# Patient Record
Sex: Male | Born: 1980 | Race: Black or African American | Hispanic: No | Marital: Married | Smoking: Current every day smoker
Health system: Southern US, Community
[De-identification: ages and names within clinical notes are randomized; demographics above are authoritative.]

## PROBLEM LIST (undated history)

## (undated) HISTORY — PX: FRACTURE SURGERY: SHX138

---

## 2017-12-16 ENCOUNTER — Encounter: Payer: Self-pay | Admitting: Emergency Medicine

## 2017-12-16 ENCOUNTER — Emergency Department: Payer: 59

## 2017-12-16 ENCOUNTER — Emergency Department
Admission: EM | Admit: 2017-12-16 | Discharge: 2017-12-16 | Disposition: A | Discharge status: Home / Self Care | Payer: 59 | Attending: Emergency Medicine | Admitting: Emergency Medicine

## 2017-12-16 DIAGNOSIS — M25512 Pain in left shoulder: Secondary | ICD-10-CM | POA: Diagnosis present

## 2017-12-16 DIAGNOSIS — R0789 Other chest pain: Secondary | ICD-10-CM | POA: Insufficient documentation

## 2017-12-16 DIAGNOSIS — F17200 Nicotine dependence, unspecified, uncomplicated: Secondary | ICD-10-CM | POA: Diagnosis not present

## 2017-12-16 LAB — BASIC METABOLIC PANEL
Anion gap: 9 (ref 5–15)
BUN: 11 mg/dL (ref 6–20)
CALCIUM: 9.3 mg/dL (ref 8.9–10.3)
CO2: 24 mmol/L (ref 22–32)
CREATININE: 0.96 mg/dL (ref 0.61–1.24)
Chloride: 105 mmol/L (ref 101–111)
Glucose, Bld: 101 mg/dL — ABNORMAL HIGH (ref 65–99)
Potassium: 3.7 mmol/L (ref 3.5–5.1)
SODIUM: 138 mmol/L (ref 135–145)

## 2017-12-16 LAB — CBC
HCT: 43.6 % (ref 40.0–52.0)
HEMOGLOBIN: 15 g/dL (ref 13.0–18.0)
MCH: 32.9 pg (ref 26.0–34.0)
MCHC: 34.3 g/dL (ref 32.0–36.0)
MCV: 95.9 fL (ref 80.0–100.0)
PLATELETS: 257 10*3/uL (ref 150–440)
RBC: 4.55 MIL/uL (ref 4.40–5.90)
RDW: 11.9 % (ref 11.5–14.5)
WBC: 9.2 10*3/uL (ref 3.8–10.6)

## 2017-12-16 LAB — TROPONIN I

## 2017-12-16 MED ORDER — IBUPROFEN 800 MG PO TABS: 800.0000 mg | ORAL_TABLET | Freq: Three times a day (TID) | ORAL | 0 refills | Status: DC | PRN

## 2017-12-16 MED ORDER — DIAZEPAM 5 MG PO TABS: 5.0000 mg | ORAL_TABLET | Freq: Three times a day (TID) | ORAL | 0 refills | Status: DC | PRN

## 2017-12-16 NOTE — ED Notes (Signed)
Pt presents today with numbness in the left arm Pt denies pain in chest or arm numbness at this time.

## 2017-12-16 NOTE — ED Provider Notes (Signed)
[facility] Emergency Department Provider Note       Time seen: ----------------------------------------- 4:05 PM on 12/16/2017 -----------------------------------------   I have reviewed the triage vital signs and the nursing notes.  HISTORY   Chief Complaint Chest Pain and Numbness    HPI Bryan Delacruz is a 37 y.o. male with no significant past medical history who presents to the ED for left shoulder pain with some associated numbness in his left arm at 11 AM today.  Currently symptoms are gone.  He does have discomfort that he describes as a funny feeling in the left side of his chest.  Patient states he has had this happen once in the past and this was diagnosed as a panic attack.  History reviewed. No pertinent past medical history.  There are no active problems to display for this patient.   Past Surgical History:  Procedure Laterality Date  . FRACTURE SURGERY      Allergies Patient has no known allergies.  Social History Social History   Tobacco Use  . Smoking status: Current Every Day Smoker  . Smokeless tobacco: Never Used  Substance Use Topics  . Alcohol use: Yes  . Drug use: Not Currently   Review of Systems Constitutional: Negative for fever. Cardiovascular: Positive for chest pain Respiratory: Negative for shortness of breath. Gastrointestinal: Negative for abdominal pain, vomiting and diarrhea. Genitourinary: Negative for dysuria. Musculoskeletal: Positive for left shoulder pain Skin: Negative for rash. Neurological: Negative for headaches, positive for transient numbness from the left elbow to the left wrist  All systems negative/normal/unremarkable except as stated in the HPI  ____________________________________________   PHYSICAL EXAM:  VITAL SIGNS: ED Triage Vitals [12/16/17 1315]  Enc Vitals Group     BP (!) 142/91     Pulse Rate 77     Resp 14     Temp 98.4 F (36.9 C)     Temp Source Oral     SpO2 98  %     Weight 160 lb (72.6 kg)     Height 5\' 11"  (1.803 m)     Head Circumference      Peak Flow      Pain Score 5     Pain Loc      Pain Edu?      Excl. in GC?    Constitutional: Alert and oriented. Well appearing and in no distress. Eyes: Conjunctivae are normal. Normal extraocular movements. ENT   Head: Normocephalic and atraumatic.   Nose: No congestion/rhinnorhea.   Mouth/Throat: Mucous membranes are moist.   Neck: No stridor. Cardiovascular: Normal rate, regular rhythm. No murmurs, rubs, or gallops. Respiratory: Normal respiratory effort without tachypnea nor retractions. Breath sounds are clear and equal bilaterally. No wheezes/rales/rhonchi. Gastrointestinal: Soft and nontender. Normal bowel sounds Musculoskeletal: Nontender with normal range of motion in extremities. No lower extremity tenderness nor edema. Neurologic:  Normal speech and language. No gross focal neurologic deficits are appreciated.  Strength, sensation, cranial nerves are normal Skin:  Skin is warm, dry and intact. No rash noted. Psychiatric: Mood and affect are normal. Speech and behavior are normal.  ____________________________________________  EKG: Interpreted by me.  Sinus rhythm with sinus arrhythmia, rate is 70 bpm, normal PR interval, normal QRS, normal QT.  ____________________________________________  ED COURSE:  As part of my medical decision making, I reviewed the following data within the electronic MEDICAL RECORD NUMBER History obtained from family if available, nursing notes, old chart and ekg, as well as notes from prior ED  visits. Patient presented for chest pain as well as shoulder pain and numbness, we will assess with labs and imaging as indicated at this time.   Procedures ____________________________________________   LABS (pertinent positives/negatives)  Labs Reviewed  BASIC METABOLIC PANEL - Abnormal; Notable for the following components:      Result Value   Glucose,  Bld 101 (*)    All other components within normal limits  CBC  TROPONIN I    RADIOLOGY Images were viewed by me  Chest x-ray is normal  ____________________________________________  DIFFERENTIAL DIAGNOSIS   Musculoskeletal pain, costochondritis, cervical radiculopathy, anxiety  FINAL ASSESSMENT AND PLAN  Nonspecific chest pain   Plan: The patient had presented for chest pain as well as shoulder pain and numbness. Patient's labs were reassuring. Patient's imaging was also reassuring.  This seems to be musculoskeletal in origin.  He will be discharged with Motrin and Valium and is stable for outpatient follow-up.   Ulice Dash, MD   Note: This note was generated in part or whole with voice recognition software. Voice recognition is usually quite accurate but there are transcription errors that can and very often do occur. I apologize for any typographical errors that were not detected and corrected.     Emily Filbert, MD 12/16/17 [id number]

## 2017-12-16 NOTE — ED Triage Notes (Signed)
Pain in left shoulder and numbness in left arm at 11a today.  Gone now.  Now with discomfort in chest that just is a funny feeling.

## 2018-05-26 ENCOUNTER — Ambulatory Visit (INDEPENDENT_AMBULATORY_CARE_PROVIDER_SITE_OTHER): Payer: 59

## 2018-05-26 ENCOUNTER — Other Ambulatory Visit: Payer: Self-pay

## 2018-05-26 ENCOUNTER — Ambulatory Visit
Admission: EM | Admit: 2018-05-26 | Discharge: 2018-05-26 | Disposition: A | Discharge status: Home / Self Care | Payer: 59 | Attending: Family Medicine | Admitting: Family Medicine

## 2018-05-26 DIAGNOSIS — S62141A Displaced fracture of body of hamate [unciform] bone, right wrist, initial encounter for closed fracture: Secondary | ICD-10-CM

## 2018-05-26 DIAGNOSIS — S62346A Nondisplaced fracture of base of fifth metacarpal bone, right hand, initial encounter for closed fracture: Secondary | ICD-10-CM

## 2018-05-26 DIAGNOSIS — W19XXXA Unspecified fall, initial encounter: Secondary | ICD-10-CM

## 2018-05-26 MED ORDER — HYDROCODONE-ACETAMINOPHEN 5-325 MG PO TABS: ORAL_TABLET | ORAL | 0 refills | Status: DC

## 2018-05-26 NOTE — ED Provider Notes (Addendum)
[facility]    CSN: [id number] Arrival date & time: 05/26/18  0918     History   Chief Complaint Chief Complaint  Patient presents with  . Hand Pain    right    HPI Bryan Delacruz is a 37 y.o. male.   37 yo male with a c/o right hand pain after falling last night while walking his dog. States he fell on the ground and landed on his hand/wrist.   The history is provided by the patient.  Hand Pain     History reviewed. No pertinent past medical history.  There are no active problems to display for this patient.   Past Surgical History:  Procedure Laterality Date  . FRACTURE SURGERY         Home Medications    Prior to Admission medications   Medication Sig Start Date End Date Taking? Authorizing Provider  diazepam (VALIUM) 5 MG tablet Take 1 tablet (5 mg total) by mouth every 8 (eight) hours as needed for muscle spasms. 12/16/17   Emily FilbertWilliams, Jonathan E, MD  HYDROcodone-acetaminophen (NORCO/VICODIN) 5-325 MG tablet 1-2 tabs po q 8 hours prn 05/26/18   Payton Mccallumonty, Samik Balkcom, MD  ibuprofen (ADVIL,MOTRIN) 800 MG tablet Take 1 tablet (800 mg total) by mouth every 8 (eight) hours as needed. 12/16/17   Emily FilbertWilliams, Jonathan E, MD    Family History History reviewed. No pertinent family history.  Social History Social History   Tobacco Use  . Smoking status: Current Every Day Smoker    Packs/day: 1.00  . Smokeless tobacco: Never Used  Substance Use Topics  . Alcohol use: Yes    Comment: occasionally  . Drug use: Not Currently     Allergies   Patient has no known allergies.   Review of Systems Review of Systems   Physical Exam Triage Vital Signs ED Triage Vitals  Enc Vitals Group     BP 05/26/18 0957 128/89     Pulse Rate 05/26/18 0957 83     Resp 05/26/18 0957 18     Temp 05/26/18 0957 98.6 F (37 C)     Temp Source 05/26/18 0957 Oral     SpO2 05/26/18 0957 98 %     Weight 05/26/18 0955 155 lb (70.3 kg)     Height 05/26/18 0955 5\' 10"  (1.778  m)     Head Circumference --      Peak Flow --      Pain Score 05/26/18 0955 10     Pain Loc --      Pain Edu? --      Excl. in GC? --    No data found.  Updated Vital Signs BP 128/89 (BP Location: Left Arm)   Pulse 83   Temp 98.6 F (37 C) (Oral)   Resp 18   Ht 5\' 10"  (1.778 m)   Wt 70.3 kg   SpO2 98%   BMI 22.24 kg/m   Visual Acuity Right Eye Distance:   Left Eye Distance:   Bilateral Distance:    Right Eye Near:   Left Eye Near:    Bilateral Near:     Physical Exam  Constitutional: He appears well-developed and well-nourished. No distress.  Musculoskeletal:       Right hand: He exhibits bony tenderness (at base of 5th metacarpal) and swelling. He exhibits normal two-point discrimination, normal capillary refill, no deformity and no laceration. Normal sensation noted. Decreased strength (5th finger) noted.  Wrist/hand neurovascularly intact  Skin: He is not  diaphoretic.  Nursing note and vitals reviewed.    UC Treatments / Results  Labs (all labs ordered are listed, but only abnormal results are displayed) Labs Reviewed - No data to display  EKG None  Radiology Dg Wrist Complete Right  Result Date: 05/26/2018 CLINICAL DATA:  Larey Seat last night with pain and swelling of the hand and wrist. EXAM: RIGHT WRIST - COMPLETE 3+ VIEW COMPARISON:  Hand radiographs same day. FINDINGS: No fracture of the wrist. See hand report for description comminuted fracture of the base of the fifth metacarpal, with small fragments and mild subluxation, with possible involvement of the hamate. IMPRESSION: No fracture of the wrist. See hand report for description of the injury at the base of the fifth metacarpal and possibly the hamate. Electronically Signed   By: Paulina Fusi M.D.   On: 05/26/2018 10:43   Dg Hand Complete Right  Result Date: 05/26/2018 CLINICAL DATA:  Fall yesterday with right hand pain and swelling, initial encounter EXAM: RIGHT HAND - COMPLETE 3+ VIEW COMPARISON:   None. FINDINGS: There is an oblique fracture through the base of the fifth metacarpal medially. Avulsion from the hamate medially is noted as well. Soft tissue swelling is seen. No other fractures are identified. IMPRESSION: Fracture through the base of the fifth metacarpal as well as an avulsion from the hamate medially. Electronically Signed   By: Alcide Clever M.D.   On: 05/26/2018 10:43    Procedures Procedures (including critical care time)  Medications Ordered in UC Medications - No data to display  Initial Impression / Assessment and Plan / UC Course  I have reviewed the triage vital signs and the nursing notes.  Pertinent labs & imaging results that were available during my care of the patient were reviewed by me and considered in my medical decision making (see chart for details).      Final Clinical Impressions(s) / UC Diagnoses   Final diagnoses:  Closed nondisplaced fracture of base of fifth metacarpal bone of right hand, initial encounter  Closed displaced fracture of hamate bone of right wrist, unspecified portion of hamate, initial encounter    ED Prescriptions    Medication Sig Dispense Auth. Provider   HYDROcodone-acetaminophen (NORCO/VICODIN) 5-325 MG tablet 1-2 tabs po q 8 hours prn 6 tablet Payton Mccallum, MD     1. x-ray results and diagnosis reviewed with patient 2. Immobilized with a ulnar gutter splint and sling 3. rx as per orders above; reviewed possible side effects, interactions, risks and benefits  4. Recommend follow up with orthopedist in the next 2-3 days 5. Follow-up prn if symptoms worsen or don't improve    Controlled Substance Prescriptions [place] Controlled Substance Registry consulted? Not Applicable   Payton Mccallum, MD 05/26/18 1120    Payton Mccallum, MD 05/26/18 [phone]

## 2018-05-26 NOTE — ED Triage Notes (Signed)
Patient complains of right hand pain that occurred last night after a fall while walking his dog. Patient states that he fell down on the ground with hand out.Patient has pain in hand and wrist.

## 2019-11-25 IMAGING — CR DG WRIST COMPLETE 3+V*R*
4 series · 4 of 4 positions shown · non-contrast
Comparison: Hand radiographs same day.

CLINICAL DATA: Fell last night with pain and swelling of the hand
and wrist.

EXAM:
RIGHT WRIST - COMPLETE 3+ VIEW

[wrist pa]
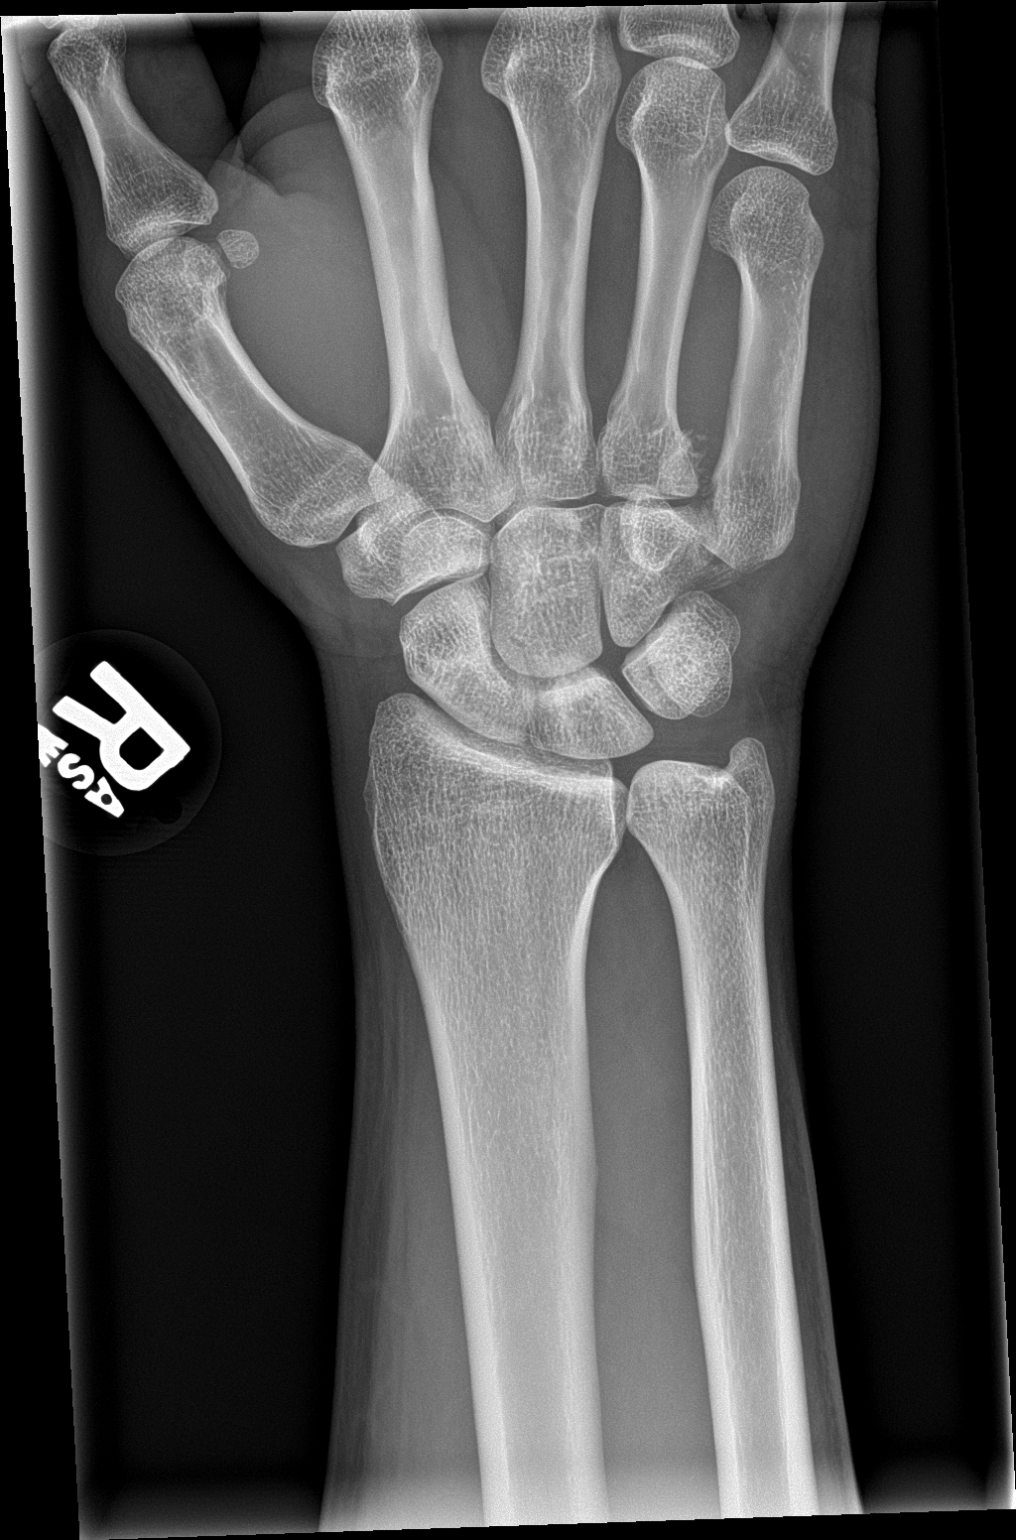

[wrist obl]
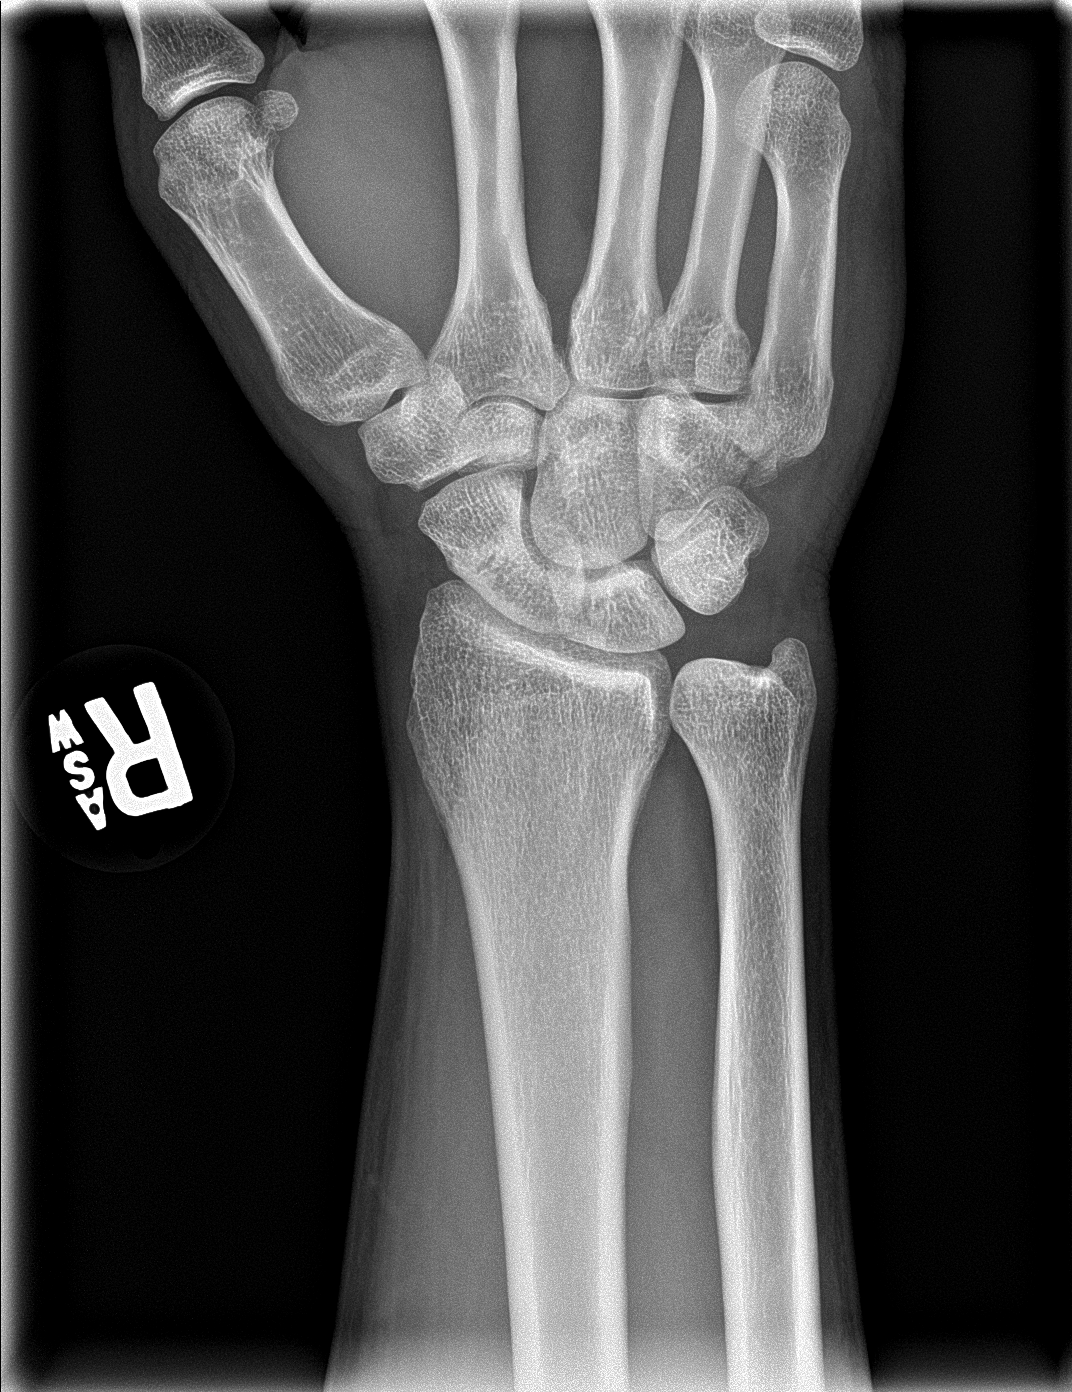

[wrist lat]
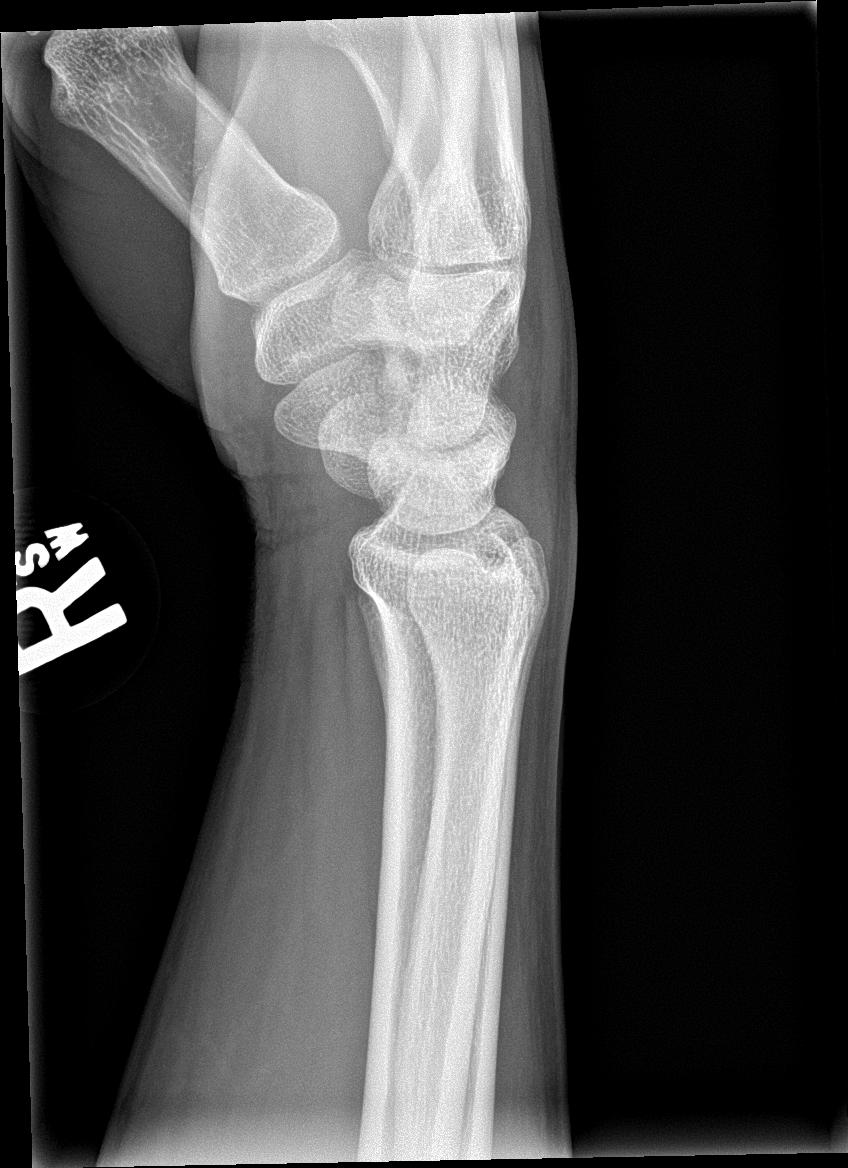

[wrist navicular]
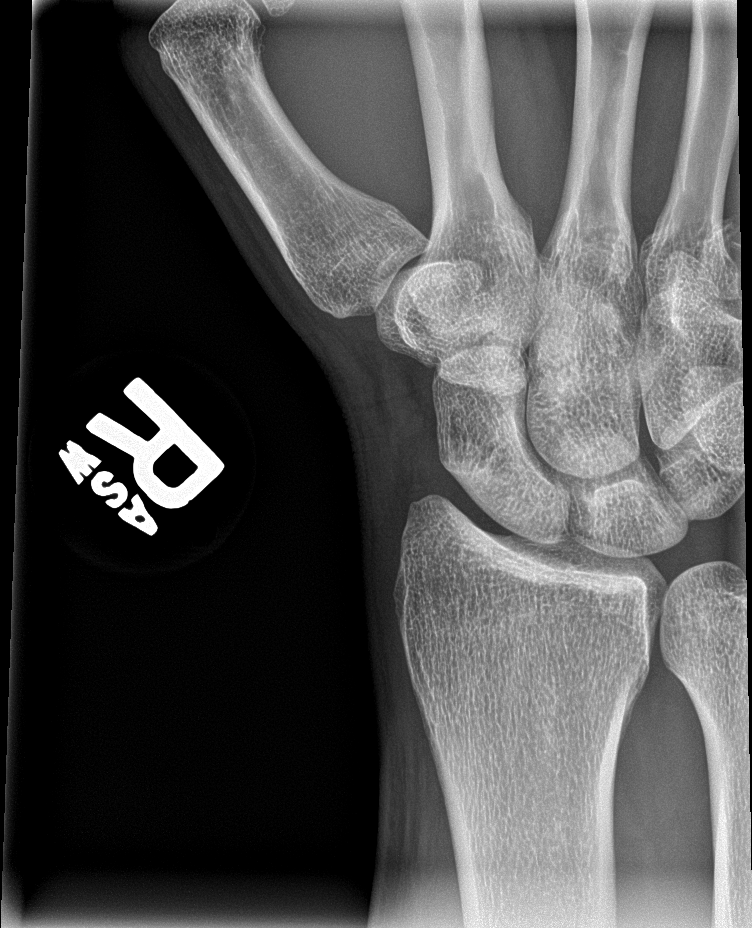

[4 of 4 positions shown; findings below may reference images not displayed]

FINDINGS: No fracture of the wrist. See hand report for description comminuted
fracture of the base of the fifth metacarpal, with small fragments
and mild subluxation, with possible involvement of the hamate.
IMPRESSION: No fracture of the wrist. See hand report for description of the
injury at the base of the fifth metacarpal and possibly the hamate.

## 2019-11-25 IMAGING — CR DG HAND COMPLETE 3+V*R*
3 series · 3 of 3 positions shown · non-contrast
Comparison: None.

CLINICAL DATA: Fall yesterday with right hand pain and swelling,
initial encounter

EXAM:
RIGHT HAND - COMPLETE 3+ VIEW

[hand ap]
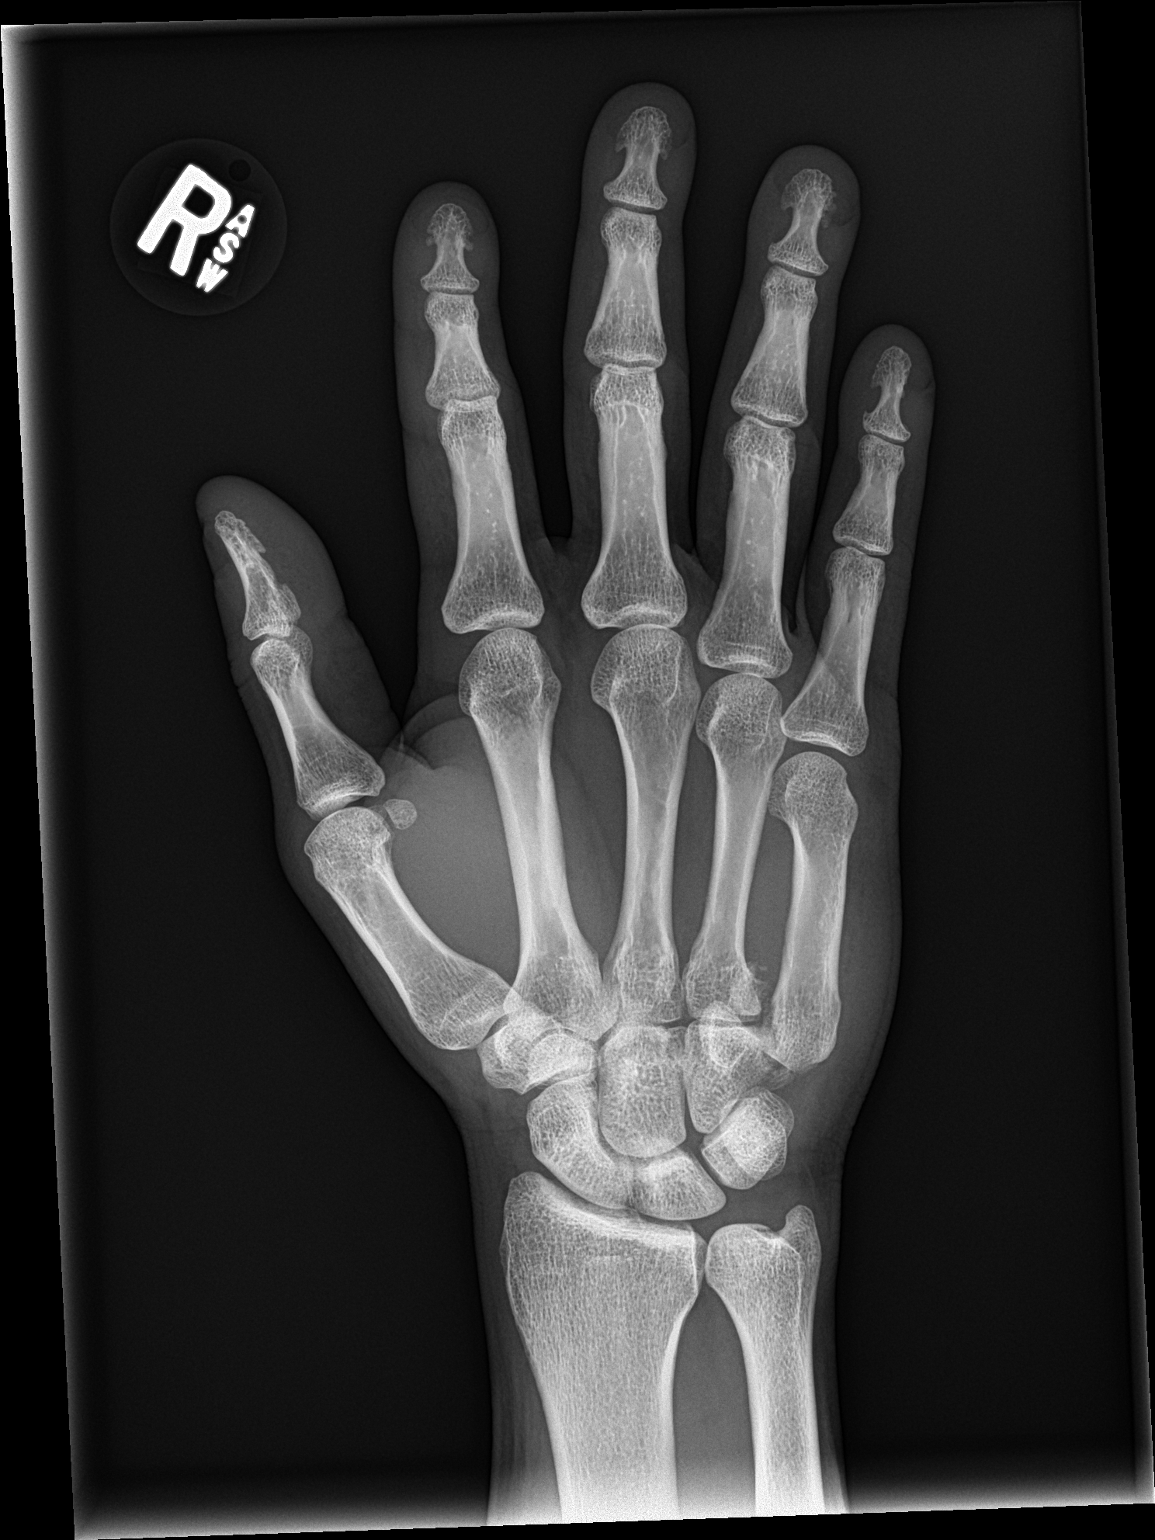

[hand obl]
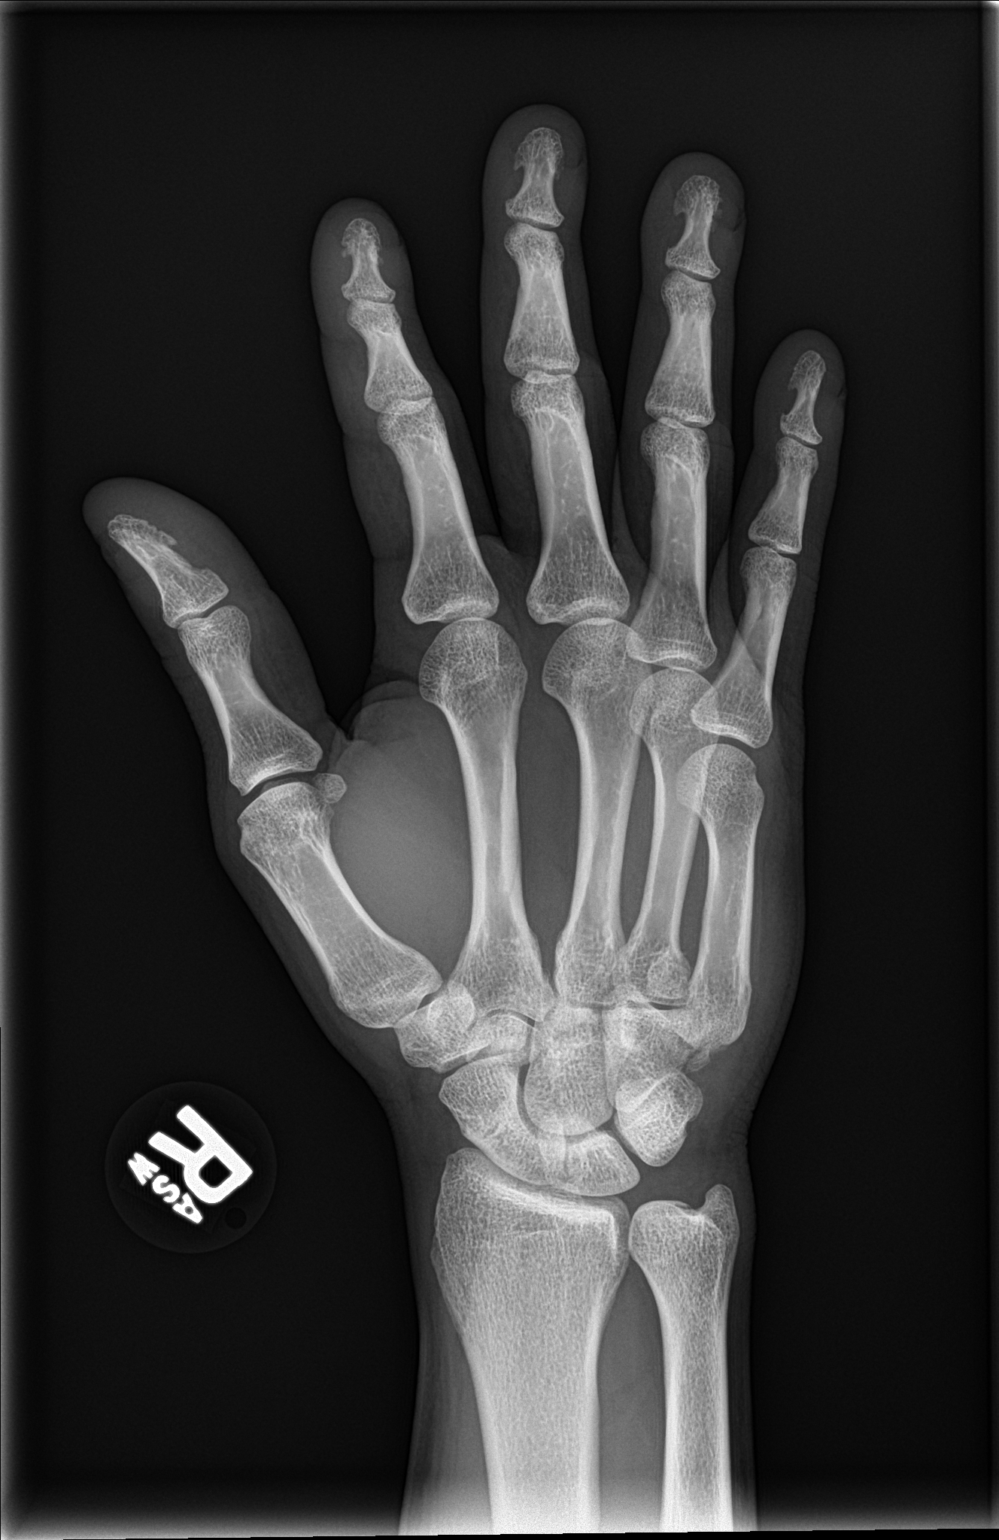

[hand lat]
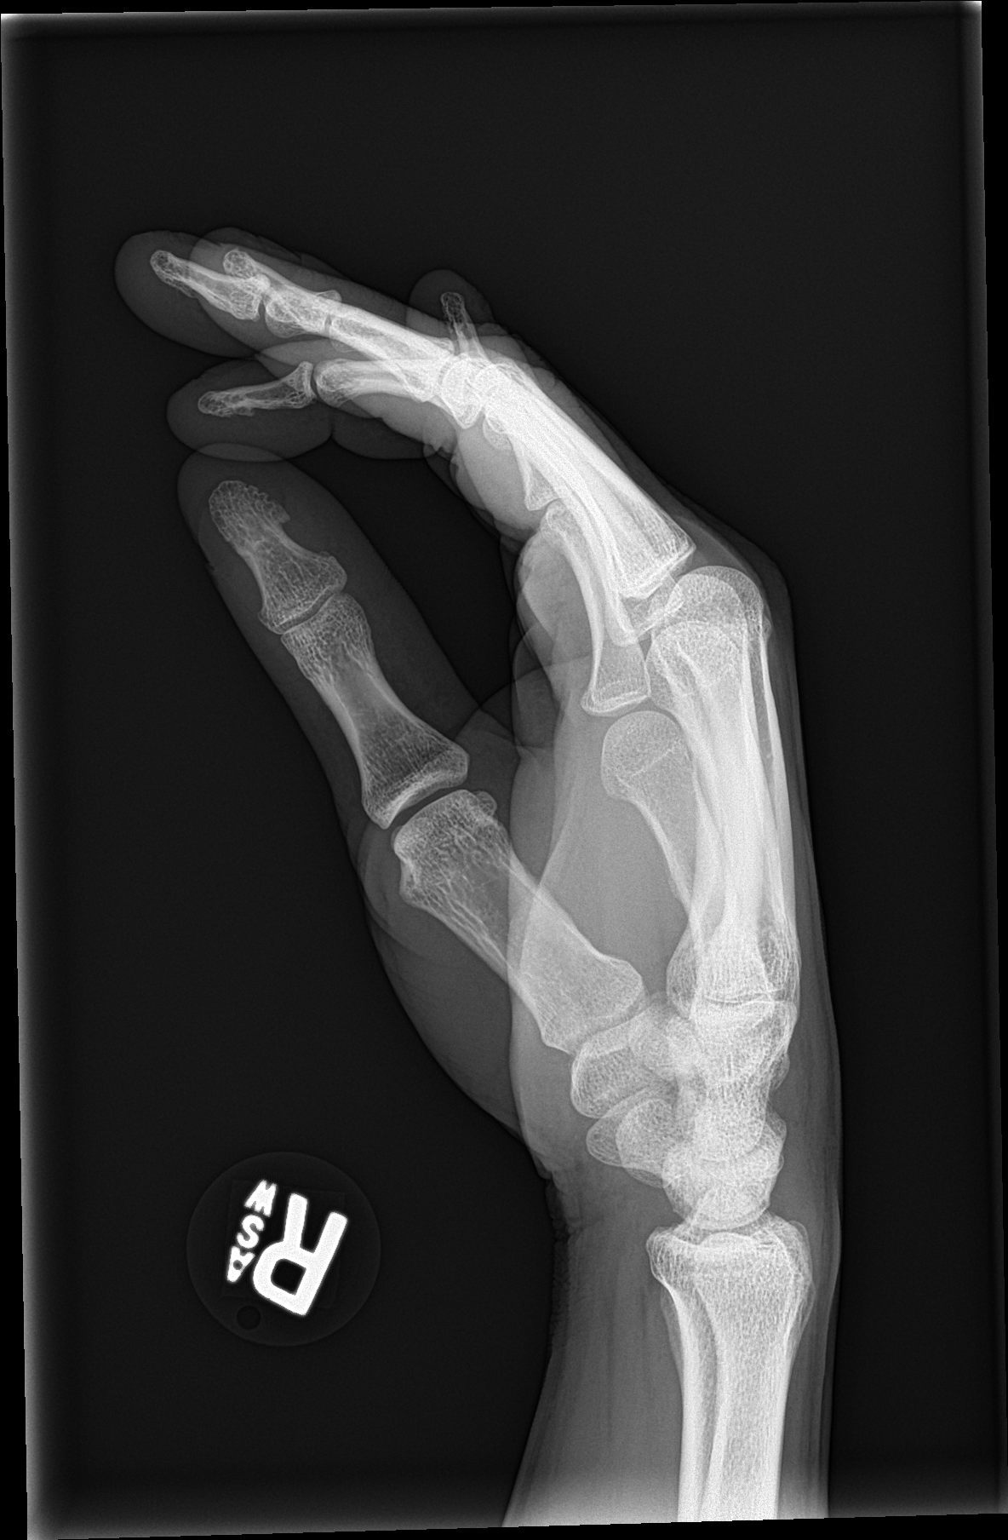

[3 of 3 positions shown; findings below may reference images not displayed]

FINDINGS: There is an oblique fracture through the base of the fifth
metacarpal medially. Avulsion from the hamate medially is noted as
well. Soft tissue swelling is seen. No other fractures are
identified.
IMPRESSION: Fracture through the base of the fifth metacarpal as well as an
avulsion from the hamate medially.

## 2019-12-11 ENCOUNTER — Ambulatory Visit
Admission: EM | Admit: 2019-12-11 | Discharge: 2019-12-11 | Disposition: A | Discharge status: Home / Self Care | Payer: 59 | Attending: Urgent Care | Admitting: Urgent Care

## 2019-12-11 ENCOUNTER — Encounter: Payer: Self-pay | Admitting: Emergency Medicine

## 2019-12-11 ENCOUNTER — Other Ambulatory Visit: Payer: Self-pay

## 2019-12-11 DIAGNOSIS — R300 Dysuria: Secondary | ICD-10-CM

## 2019-12-11 DIAGNOSIS — Z202 Contact with and (suspected) exposure to infections with a predominantly sexual mode of transmission: Secondary | ICD-10-CM

## 2019-12-11 DIAGNOSIS — Z113 Encounter for screening for infections with a predominantly sexual mode of transmission: Secondary | ICD-10-CM

## 2019-12-11 LAB — URINALYSIS, COMPLETE (UACMP) WITH MICROSCOPIC
Bacteria, UA: NONE SEEN
Bilirubin Urine: NEGATIVE
Glucose, UA: NEGATIVE mg/dL
Hgb urine dipstick: NEGATIVE
Ketones, ur: NEGATIVE mg/dL
Leukocytes,Ua: NEGATIVE
Nitrite: NEGATIVE
Protein, ur: NEGATIVE mg/dL
RBC / HPF: NONE SEEN RBC/hpf (ref 0–5)
Specific Gravity, Urine: 1.01 (ref 1.005–1.030)
Squamous Epithelial / LPF: NONE SEEN (ref 0–5)
WBC, UA: NONE SEEN WBC/hpf (ref 0–5)
pH: 6 (ref 5.0–8.0)

## 2019-12-11 LAB — CHLAMYDIA/NGC RT PCR (ARMC ONLY)??????????: N gonorrhoeae: NOT DETECTED

## 2019-12-11 LAB — CHLAMYDIA/NGC RT PCR (ARMC ONLY): Chlamydia Tr: NOT DETECTED

## 2019-12-11 NOTE — ED Provider Notes (Signed)
[place]   Name: Aiyden Lauderback DOB: 05/31/1981 MRN: [id number] CSN: [id number] PCP: [facility]  Arrival date and time:  12/11/19 1433  Chief Complaint:  Dysuria  NOTE: Prior to seeing the patient today, I have reviewed the triage nursing documentation and vital signs. Clinical staff has updated patient's PMH/PSHx, current medication list, and drug allergies/intolerances to ensure comprehensive history available to assist in medical decision making.   History:   HPI: Zafar Debrosse is a 39 y.o. male who presents today with complaints of urinary symptoms that began with acute onset 2 days ago. He complains of dysuria, frequency, and urgency. He has not appreciated any gross hematuria, nor has he noticed his urine being malodorous. Patient denies any associated nausea, vomiting, fever, or chills. He has not experienced any pain in his lower back or flank area, however notes pain and pressure in his suprapubic area. Patient advises that he does not have a past medical history that is significant for recurrent urinary tract infections, STIs, or urolithiasis. Patient denies that he engages in unprotected sexual activity. Patient states, " I just got back from [place] and I did something that I wasn't supposed to do". He denies pain in his penis, testicles, or scrotum; no swelling. No penile bleeding or discharge.   History reviewed. No pertinent past medical history.  Past Surgical History:  Procedure Laterality Date  . FRACTURE SURGERY      Family History  Problem Relation Age of Onset  . Healthy Mother   . Healthy Father     Social History   Tobacco Use  . Smoking status: Current Every Day Smoker    Packs/day: 1.00    Types: Cigarettes  . Smokeless tobacco: Never Used  Substance Use Topics  . Alcohol use: Yes    Comment: occasionally  . Drug use: Not Currently    There are no problems to display for this patient.   Home Medications:    No outpatient  medications have been marked as taking for the 12/11/19 encounter [facility] Encounter).    Allergies:   Patient has no known allergies.  Review of Systems (ROS):  Review of systems NEGATIVE unless otherwise noted in narrative H&P section.   Vital Signs: Today's Vitals   12/11/19 1449 12/11/19 1453  BP:  (!) 129/98  Pulse:  79  Resp:  18  Temp:  98.1 F (36.7 C)  TempSrc:  Oral  SpO2:  99%  Weight: 155 lb (70.3 kg)   Height: 5\' 11"  (1.803 m)   PainSc: 0-No pain     Physical Exam: Physical Exam  Constitutional: He is oriented to person, place, and time and well-developed, well-nourished, and in no distress.  HENT:  Head: Normocephalic and atraumatic.  Eyes: Pupils are equal, round, and reactive to light.  Cardiovascular: Normal rate, regular rhythm, normal heart sounds and intact distal pulses.  Pulmonary/Chest: Effort normal and breath sounds normal.  Abdominal: Soft. Normal appearance and bowel sounds are normal. He exhibits no distension. There is no abdominal tenderness. There is no CVA tenderness.  Genitourinary:    Genitourinary Comments: Exam deferred. No penile pain or bleeding. No testicular symptoms. No reported penile lesions. Random urine collected for [facility] testing. Clean catch collected for UA.    Neurological: He is alert and oriented to person, place, and time. Gait normal.  Skin: Skin is warm and dry. No rash noted. He is not diaphoretic.  Psychiatric: Memory, affect and judgment normal. His mood appears anxious.  Nursing  note and vitals reviewed.   Urgent Care Treatments / Results:   Orders Placed This Encounter  Procedures  . Chlamydia/NGC rt PCR ([facility] only)  . Urinalysis, Complete w Microscopic    LABS: PLEASE NOTE: all labs that were ordered this encounter are listed, however only abnormal results are displayed. Labs Reviewed  CHLAMYDIA/NGC RT PCR ([facility] ONLY)  URINALYSIS, COMPLETE ([facility]) WITH MICROSCOPIC     EKG: -None  RADIOLOGY: No results found.  PROCEDURES: Procedures  MEDICATIONS RECEIVED THIS VISIT: Medications - No data to display  PERTINENT CLINICAL COURSE NOTES/UPDATES:   Initial Impression / Assessment and Plan / Urgent Care Course:  Pertinent labs & imaging results that were available during my care of the patient were personally reviewed by me and considered in my medical decision making (see lab/imaging section of note for values and interpretations).  Monte Zinni is a 39 y.o. male who presents to [facility] today with complaints of Dysuria  Patient is well appearing overall in clinic today. He does not appear to be in any acute distress. Presenting symptoms (see HPI) and exam as documented above. Symptoms following possible exposure to sexual partner (extra-marital). He denies PMH of STI in the past. UA today negative for infection; 0 WBC/hpf, 0 RBC/hpf, and no nitrites, LE, or bacteria. Urine for GC testing has been sent to the hospital for testing. Discussed prophylactic treatment vs. waiting for testing to result. Reviewed new CDC treatment guidelines that have increased the ceftriaxone dose for gonorrhea, and changed to a 7 day course of oral ABX for gonorrhea. Patient wishes to wait for the testing results at this time rather than proceeding with prophylactic treatment. Patient to be contacted with testing results should he require treatment; verbalized understanding. Patient encouraged to abstain from sexual activity until results have been received. Written information on safe sexual practices provided today on patient's discharge AVS.   Discussed follow up with primary care physician in 1 week for re-evaluation. I have reviewed the follow up and strict return precautions for any new or worsening symptoms. Patient is aware of symptoms that would be deemed urgent/emergent, and would thus require further evaluation either here or in the emergency department. At  the time of discharge, he verbalized understanding and consent with the discharge plan as it was reviewed with him. All questions were fielded by provider and/or clinic staff prior to patient discharge.    Final Clinical Impressions / Urgent Care Diagnoses:   Final diagnoses:  Possible exposure to STD  Routine screening for STI (sexually transmitted infection)    New Prescriptions:  [place] Controlled Substance Registry consulted? Not Applicable  No orders of the defined types were placed in this encounter.   Recommended Follow up Care:  Patient encouraged to follow up with the following provider within the specified time frame, or sooner as dictated by the severity of his symptoms. As always, he was instructed that for any urgent/emergent care needs, he should seek care either here or in the emergency department for more immediate evaluation.  Follow-up Information    [place] In 1 week.   Why: General reassessment of symptoms if not improving Contact information: [address] [place] [address] [phone]         NOTE: This note was prepared using Dragon dictation software along with smaller phrase technology. Despite my best ability to proofread, there is the potential that transcriptional errors may still occur from this process, and are completely unintentional.  Verlee Monte, NP 12/11/19 1550

## 2019-12-11 NOTE — ED Triage Notes (Signed)
Pt c/o dysuria. Started about 2 days ago. Denies any penile discharge, urinary retention. He would like be checked for STDs, He has no known exposures but he states that he just got back from [place] and "I did do something I wasn't suppose to do"

## 2019-12-11 NOTE — Discharge Instructions (Addendum)
It was very nice seeing you today in clinic. Thank you for entrusting me with your care.   STD testing pending. We will call you if it is positive with further directions.   Make arrangements to follow up with your regular doctor in 1 week for re-evaluation if not improving. If your symptoms/condition worsens, please seek follow up care either here or in the ER. Please remember, our [facility] providers are "right here with you" when you need [place].   Again, it was my pleasure to take care of you today. Thank you for choosing our clinic. I hope that you start to feel better quickly.   Quentin Mulling, MSN, APRN, FNP-C, CEN Advanced Practice Provider [facility] [place] [facility]
